# Patient Record
Sex: Female | Born: 2000 | Race: Black or African American | Hispanic: No | Marital: Single | Smoking: Never smoker
Health system: Southern US, Community
[De-identification: ages and names within clinical notes are randomized; demographics above are authoritative.]

## PROBLEM LIST (undated history)

## (undated) DIAGNOSIS — R569 Unspecified convulsions: Secondary | ICD-10-CM

## (undated) DIAGNOSIS — F840 Autistic disorder: Secondary | ICD-10-CM

## (undated) HISTORY — PX: WISDOM TOOTH EXTRACTION: SHX21

## (undated) HISTORY — PX: NO PAST SURGERIES: SHX2092

---
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
## 2021-04-20 ENCOUNTER — Telehealth (INDEPENDENT_AMBULATORY_CARE_PROVIDER_SITE_OTHER): Payer: Self-pay | Admitting: Family

## 2021-04-20 DIAGNOSIS — R569 Unspecified convulsions: Secondary | ICD-10-CM

## 2021-04-20 NOTE — Telephone Encounter (Signed)
  Who's calling (name and relationship to patient) :[place] ( self)   Best contact [phone]  Provider they UUE:[id number]   Reason for call: Needing a Pror Auth called in for medication refill [facility] Call ID # [id number]     PRESCRIPTION REFILL ONLY  Name of prescription: Levetiracetam  Pharmacy:[facility] [address] [place]

## 2021-04-21 ENCOUNTER — Encounter (INDEPENDENT_AMBULATORY_CARE_PROVIDER_SITE_OTHER): Payer: Self-pay

## 2021-04-22 MED ORDER — LEVETIRACETAM 750 MG PO TABS: ORAL_TABLET | ORAL | 5 refills | Status: DC

## 2021-04-22 NOTE — Telephone Encounter (Signed)
Spoke to patient and she just needed a refill, refill sent

## 2021-08-02 ENCOUNTER — Telehealth (INDEPENDENT_AMBULATORY_CARE_PROVIDER_SITE_OTHER): Payer: Self-pay | Admitting: Family

## 2021-08-02 NOTE — Telephone Encounter (Signed)
Attempted to call patient, left VM.

## 2021-08-02 NOTE — Telephone Encounter (Signed)
  Who's calling (name and relationship to patient) :Melvyn Neth [place] M (Self)  Best contact number: [phone] (Mobile) Provider they see: Elveria Rising, NP Reason for call:  Patient has seizure on Saturday and the earliest appt available has been scheduled which is 8/25. Patient hoping to be seen sooner if possible. Please advise   PRESCRIPTION REFILL ONLY  Name of prescription:  Pharmacy:

## 2021-08-02 NOTE — Telephone Encounter (Signed)
I called and spoke with patient. She said that on Saturday August 13th that she was sitting on the sofa, heard voices, and then awakened with paramedics by her side. She was not injured with the seizure. She said that she has been compliant with medication and gets at least 8 hours of sleep each night. I offered to see her tomorrow morning but she declined because she has to to go work. I will see her next week as scheduled. TG

## 2021-08-02 NOTE — Telephone Encounter (Signed)
I can see her tomorrow if she can come in at 10:45AM to check in for an 11 AM visit. TG

## 2021-08-11 ENCOUNTER — Encounter (INDEPENDENT_AMBULATORY_CARE_PROVIDER_SITE_OTHER): Payer: Self-pay | Admitting: Family

## 2021-08-11 ENCOUNTER — Ambulatory Visit (INDEPENDENT_AMBULATORY_CARE_PROVIDER_SITE_OTHER): Payer: Medicaid Other | Admitting: Family

## 2021-08-11 ENCOUNTER — Other Ambulatory Visit: Payer: Self-pay

## 2021-08-11 VITALS — BP 130/80 | HR 76 | Ht 66.5 in | Wt 200.6 lb

## 2021-08-11 DIAGNOSIS — F840 Autistic disorder: Secondary | ICD-10-CM | POA: Diagnosis not present

## 2021-08-11 DIAGNOSIS — R569 Unspecified convulsions: Secondary | ICD-10-CM | POA: Diagnosis not present

## 2021-08-11 MED ORDER — OXCARBAZEPINE 300 MG PO TABS: ORAL_TABLET | ORAL | 1 refills | Status: DC

## 2021-08-11 NOTE — Patient Instructions (Addendum)
Thank you for coming in today.   Instructions for you until your next appointment are as follows: Continue to take the Levetiracetam 750mg  - 1 tablet in the morning and 1 tablet at night Start taking Oxcarbazepine 300mg  - take 1/2 tablet in the morning and 1/2 tablet at night for 3 days, then take 1 tablet in the morning and at night after that Try not to miss any doses of your seizure medicines Please sign up for MyChart if you have not done so. Please plan to return for follow up in 6 weeks or sooner if needed.  At [facility], we are committed to providing exceptional care. You will receive a patient satisfaction survey through text or email regarding your visit today. Your opinion is important to me. Comments are appreciated.

## 2021-08-11 NOTE — Progress Notes (Signed)
Caitlyn Weaver   MRN:  [id number]  [birth year]   Provider: Elveria Rising NP-C Location of Care: [facility]  Visit type: Routine visit  Last visit: 10/19/2020  Referral source: Sharilyn Sites, PA-C History from: patient and chcn chart  Brief history:  Copied from previous record: History of autism spectrum disorder and new onset seizure activity on 07/26/2019 that lasted approximately 15 minutes. She was seen in the ER and then evaluated in the office by Dr Devonne Doughty. An EEG was normal. She had a second seizure on September 4 and was started on Levetiracetam on September 03, 2019. The most recent seizure occurred January 31, 2020 and the dose was increased at that time.  Today's concerns: Caitlyn is seen today because she contacted me last week to report that she had a seizure. On Saturday August 13th she was sitting on the sofa, heard voices, then awakened with paramedics at her side and was told that she had a convulsive seizure that lasted 3-4 minutes. She was not injured with the seizure. She was evaluated by EMS and not seen in the ED. Caitlyn has been compliant with medication and says that she gets at least 8 hours of sleep each night.   Caitlyn has been otherwise generally healthy since she was last seen. She has no other health concerns today other than previously mentioned.  Review of systems: Please see HPI for neurologic and other pertinent review of systems. Otherwise all other systems were reviewed and were negative.  Problem List: Patient Active Problem List   Diagnosis Date Noted   Seizures (HCC) 09/04/2019   Autism      Past Medical History:  Diagnosis Date   Autism    Seizures (HCC)     Past medical history comments: See HPI Copied from previous record: Birth History She was born full-term via normal vaginal delivery with no perinatal events.  Her birth weight was 7 pounds 9 ounces.  She developed delayed milestones on time  Surgical  history: Past Surgical History:  Procedure Laterality Date   NO PAST SURGERIES       Family history: family history is not on file.   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   She attended [employer].   She lives with both parents.    She is employed with [employer]   Social Determinants of Corporate investment banker Strain: Not on file  Food Insecurity: Not on file  Transportation Needs: Not on file  Physical Activity: Not on file  Stress: Not on file  Social Connections: Not on file  Intimate Partner Violence: Not on file   Past/failed meds:  Allergies: No Known Allergies   Immunizations:  There is no immunization history on file for this patient.    Diagnostics/Screenings: Copied from previous record: 08/12/2019 - rEEG - This EEG is normal during awake state.  Please note that normal EEG does not exclude epilepsy, clinical correlation is indicated. Keturah Shavers, MD  Physical Exam: BP 130/80   Pulse 76   Ht 5' 6.5" (1.689 m)   Wt 200 lb 9.6 oz (91 kg)   BMI 31.89 kg/m   General: Well developed, well nourished young woman, seated on exam table, in no evident distress,  black hair, brown eyes, right handed Head: Head normocephalic and atraumatic.  Oropharynx benign. Neck: Supple Cardiovascular: Regular rate and rhythm, no murmurs Respiratory: Breath sounds clear to auscultation Musculoskeletal: No obvious deformities or scoliosis Skin: No rashes or neurocutaneous  lesions  Neurologic Exam Mental Status: Awake and fully alert.  Oriented to place and time.  Recent and remote memory intact.  Attention span and concentration appropriate. Fund of knowledge subnormal for age. Has flat affect. Has concrete language. She needed some prompting and coaching in order to participate in examination. Cranial Nerves: Fundoscopic exam reveals sharp disc margins.  Pupils equal, briskly reactive to light.  Extraocular movements full without nystagmus.  Visual fields full to confrontation.  Hearing intact and symmetric to finger rub.  Facial sensation intact.  Face tongue, palate move normally and symmetrically.  Neck flexion and extension normal. Motor: Normal bulk and tone. Normal strength in all tested extremity muscles. Sensory: Intact to touch and temperature in all extremities.  Coordination: Rapid alternating movements normal in all extremities.  Finger-to-nose and heel-to shin performed accurately bilaterally.  Romberg negative. Gait and Station: Arises from chair without difficulty.  Stance is normal. Gait demonstrates normal stride length and balance.   Able to heel, toe and tandem walk without difficulty. Reflexes: 1+ and symmetric. Toes downgoing.   Impression: Seizures (HCC) - Plan: Oxcarbazepine (TRILEPTAL) 300 MG tablet  Autism   Recommendations for plan of care: The patient's previous [facility] records were reviewed. Caitlyn has neither had nor required imaging or lab studies since the last visit. 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 She has been taking and tolerating Levetiracetam and had been seizure free since January 31, 2020 until she had a breakthrough seizure on July 30, 2021. She has been compliant with medication and gets at least 8 hours of sleep each night I recommended that we add Oxcarbazepine to her regimen and reviewed with her how to take the medication. I asked her to return for follow up in 6 weeks or  sooner if needed. Caitlyn agreed with the plans made today.   The medication list was reviewed and reconciled. No changes were made in the prescribed medications today. A complete medication list was provided to the patient.  Return in about 6 weeks (around 09/22/2021).   Allergies as of 08/11/2021   No Known Allergies      Medication List        Accurate as of August 11, 2021 11:59 PM. If you have any questions, ask your nurse or doctor.          levETIRAcetam 750 MG tablet Commonly known as: Keppra Take 1 tablet twice per day   Oxcarbazepine 300 MG tablet Commonly known as: TRILEPTAL Take 1/2 tablet twice per day for 3 days, then take 1 tablet twice per day thereafter Started by: Elveria Rising, NP   Valtoco 20 MG Dose 2 x 10 MG/0.1ML Lqpk Generic drug: diazePAM (20 MG Dose) Place 20 mg into the nose as needed (Give nasally for seizures lasting 2 minutes or longer).  Total time spent with the patient was 30 minutes, of which 50% or more was spent in counseling and coordination of care.  Elveria Rising NP-C [facility] Ph. [phone] Fax [phone]

## 2021-08-20 ENCOUNTER — Encounter (INDEPENDENT_AMBULATORY_CARE_PROVIDER_SITE_OTHER): Payer: Self-pay | Admitting: Family

## 2021-09-27 ENCOUNTER — Other Ambulatory Visit: Payer: Self-pay

## 2021-09-27 ENCOUNTER — Ambulatory Visit (INDEPENDENT_AMBULATORY_CARE_PROVIDER_SITE_OTHER): Payer: Medicaid Other | Admitting: Family

## 2021-09-27 ENCOUNTER — Encounter (INDEPENDENT_AMBULATORY_CARE_PROVIDER_SITE_OTHER): Payer: Self-pay | Admitting: Family

## 2021-09-27 VITALS — BP 128/88 | HR 88 | Ht 66.73 in | Wt 191.2 lb

## 2021-09-27 DIAGNOSIS — R440 Auditory hallucinations: Secondary | ICD-10-CM | POA: Diagnosis not present

## 2021-09-27 DIAGNOSIS — F840 Autistic disorder: Secondary | ICD-10-CM | POA: Diagnosis not present

## 2021-09-27 DIAGNOSIS — R569 Unspecified convulsions: Secondary | ICD-10-CM

## 2021-09-29 NOTE — Progress Notes (Signed)
Caitlyn Weaver   MRN:  [id number]  07-30-2001   Provider: Elveria Rising NP-C Location of Care: [facility] Child Neurology  Visit type: Routine return visit  Last visit: 08/11/2021  Referral source: Sharilyn Sites, PA-C History from: Epic chart and patient  Brief history:  Copied from previous record: History of autism spectrum disorder and new onset seizure activity on 07/26/2019 that lasted approximately 15 minutes. She was seen in the ER and then evaluated in the office by Dr Devonne Doughty. An EEG was normal. She had a second seizure on September 4 and was started on Levetiracetam on September 03, 2019. The most recent seizure occurred on July 30, 2021. Oxcarbazepine was added at that time.  Today's concerns: Caitlyn reports today that she has remained seizure free since Oxcarbazepine was added to her regimen. She has tolerated the medication without obvious side effects. When the last seizure occurred, Caitlyn reported hearing voices prior to the seizure. She reports today that she continues to hear voices or sometimes music, but that words or sounds are pleasant and not disturbing to her. She is compliant with medication and gets at least 8 hours of sleep each night.   Caitlyn has been otherwise generally healthy since she was last seen. She has no other health concerns today other than previously mentioned.  Review of systems: Please see HPI for neurologic and other pertinent review of systems. Otherwise all other systems were reviewed and were negative.  Problem List: Patient Active Problem List   Diagnosis Date Noted   Seizures (HCC) 09/04/2019   Autism      Past Medical History:  Diagnosis Date   Autism    Seizures (HCC)     Past medical history comments: See HPI Copied from previous record: Birth History She was born full-term via normal vaginal delivery with no perinatal events.  Her birth weight was 7 pounds 9 ounces.  She developed delayed milestones on  time  Surgical history: Past Surgical History:  Procedure Laterality Date   NO PAST SURGERIES       Family history: family history is not on file.   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   She attended [employer].   She lives with both parents.    She is employed with [employer]   Social Determinants of Corporate investment banker Strain: Not on file  Food Insecurity: Not on file  Transportation Needs: Not on file  Physical Activity: Not on file  Stress: Not on file  Social Connections: Not on file  Intimate Partner Violence: Not on file    Past/failed meds:  Allergies: No Known Allergies   Immunizations:  There is no immunization history on file for this patient.   Diagnostics/Screenings: Copied from previous record: 08/12/2019 - rEEG - This EEG is normal during awake state. Please note that normal EEG does not exclude epilepsy,  clinical correlation is indicated. Keturah Shavers, MD  Physical Exam: BP 128/88   Pulse 88   Ht 5' 6.73" (1.695 m)   Wt 191 lb 3.2 oz (86.7 kg)   BMI 30.19 kg/m   General: Well developed, well nourished young woman, seated on exam table, in no evident distress Head: Head normocephalic and atraumatic.  Oropharynx benign. Neck: Supple Cardiovascular: Regular rate and rhythm, no murmurs Respiratory: Breath sounds clear to auscultation Musculoskeletal: No obvious deformities or scoliosis Skin: No rashes or neurocutaneous lesions  Neurologic  Exam Mental Status: Awake and fully alert.  Oriented to place and time.  Recent and remote memory intact.  Attention span and concentration appropriate for age. Fund of knowledge subnormal for age. Has flat affect and concrete language. Eye contact is variable. She needed some prompting and coaching to answer questions.  Cranial Nerves: Fundoscopic exam reveals sharp disc margins.  Pupils equal, briskly reactive to light.  Extraocular movements full without nystagmus. Hearing intact and symmetric to whisper.  Facial sensation intact.  Face tongue, palate move normally and symmetrically. Shoulder shrug normal Motor: Normal bulk and tone. Normal strength in all tested extremity muscles. Sensory: Intact to touch and temperature in all extremities.  Coordination: Rapid alternating movements normal in all extremities.  Finger-to-nose and heel-to shin performed accurately bilaterally.  Romberg negative. Gait and Station: Arises from chair without difficulty.  Stance is normal. Gait demonstrates normal stride length and balance.   Able to heel, toe and tandem walk without difficulty. Reflexes: 1+ and symmetric. Toes downgoing.   Impression: Seizures (HCC)  Autism  Auditory hallucinations  Recommendations for plan of care: The patient's previous [facility] records were reviewed. Caitlyn has neither had nor required imaging or lab studies since the last visit. She is an 20 year old young woman with history of seizures and autism. She is taking and tolerating Levetiracetam and Oxcarbazepine, with the later being added in August after a seizure event. I talked with Caitlyn about being compliant with medication and getting enough sleep. I am concerned about her reports of auditory hallucinations but as she reports that the hallucinations are pleasant and not troubling to her, I will continue to monitor this for now. I asked Caitlyn to let me know if she has more breakthrough seizures. I will otherwise see her back  in follow up in 4 months or sooner if needed. She agreed with the plans made today.   The medication list was reviewed and reconciled. No changes were made in the prescribed medications today. A complete medication list was provided to the patient.  Return in about 4 months (around 01/28/2022).   Allergies as of 09/27/2021   No Known Allergies      Medication List        Accurate as of September 27, 2021 11:59 PM. If you have any questions, ask your nurse or doctor.          levETIRAcetam 750 MG tablet Commonly known as: Keppra Take 1 tablet twice per day   Oxcarbazepine 300 MG tablet Commonly known as: TRILEPTAL Take 1 tablet twice per day What changed: additional instructions Changed by: Elveria Rising, NP   Valtoco 20 MG Dose 2 x 10 MG/0.1ML Lqpk Generic drug: diazePAM (20 MG Dose) Place 20 mg into the nose as needed (Give nasally for seizures lasting 2 minutes or longer).      Total time spent with the patient was 20 minutes, of which 50% or more was spent in counseling and coordination  of care.  Rockwell Germany NP-C [facility] Ph. [phone] Fax [phone]

## 2021-10-01 ENCOUNTER — Encounter (INDEPENDENT_AMBULATORY_CARE_PROVIDER_SITE_OTHER): Payer: Self-pay | Admitting: Family

## 2021-10-01 DIAGNOSIS — R440 Auditory hallucinations: Secondary | ICD-10-CM | POA: Insufficient documentation

## 2021-10-01 MED ORDER — LEVETIRACETAM 750 MG PO TABS: ORAL_TABLET | ORAL | 5 refills | Status: DC

## 2021-10-01 MED ORDER — OXCARBAZEPINE 300 MG PO TABS: ORAL_TABLET | ORAL | 3 refills | Status: DC

## 2021-10-01 NOTE — Patient Instructions (Signed)
Thank you for coming in today.   Instructions for you until your next appointment are as follows: Continue taking the Levetiracetam 750mg  and Oxcarbazepine 300mg  tablets as prescribed Let me know if you have any seizures.  Try not to miss any doses of medication.  Be sure to get at least 8 hours of sleep each night, as not getting enough sleep can trigger seizures Please sign up for MyChart if you have not done so. Please plan to return for follow up in 4 months or sooner if needed.  At [facility], we are committed to providing exceptional care. You will receive a patient satisfaction survey through text or email regarding your visit today. Your opinion is important to me. Comments are appreciated.

## 2022-01-31 ENCOUNTER — Encounter (INDEPENDENT_AMBULATORY_CARE_PROVIDER_SITE_OTHER): Payer: Self-pay | Admitting: Family

## 2022-01-31 ENCOUNTER — Other Ambulatory Visit: Payer: Self-pay

## 2022-01-31 ENCOUNTER — Ambulatory Visit (INDEPENDENT_AMBULATORY_CARE_PROVIDER_SITE_OTHER): Payer: Medicaid Other | Admitting: Family

## 2022-01-31 VITALS — BP 122/64 | Wt 200.0 lb

## 2022-01-31 DIAGNOSIS — F840 Autistic disorder: Secondary | ICD-10-CM | POA: Diagnosis not present

## 2022-01-31 DIAGNOSIS — R569 Unspecified convulsions: Secondary | ICD-10-CM | POA: Diagnosis not present

## 2022-01-31 DIAGNOSIS — R440 Auditory hallucinations: Secondary | ICD-10-CM

## 2022-01-31 MED ORDER — LEVETIRACETAM 750 MG PO TABS: ORAL_TABLET | ORAL | 3 refills | Status: DC

## 2022-01-31 MED ORDER — OXCARBAZEPINE 300 MG PO TABS: ORAL_TABLET | ORAL | 3 refills | Status: DC

## 2022-01-31 NOTE — Progress Notes (Signed)
Panama M Belfield   MRN:  [id number]  [birth year]   Provider: Elveria Rising NP-C Location of Care: [facility] Neurology  Visit type: Return visit  Last visit: 09/27/2021  Referral source: Sharilyn Sites, PA-C History from: Epic chart and patient  Brief history:  Copied from previous record: History of autism spectrum disorder and new onset seizure activity on 07/26/2019 that lasted approximately 15 minutes. She was seen in the ER and then evaluated in the office by Dr Devonne Doughty. An EEG was normal. She had a second seizure on September 4 and was started on Levetiracetam on September 03, 2019. The most recent seizure occurred on July 30, 2021. Oxcarbazepine was added at that time.   Today's concerns: Panama reports today that she had a seizure on December 1st. She said that she had some espresso coffee and a short time later had auditory hallucinations, then a convulsive seizure. She is unsure how long it lasted but says that her mother administered Valtoco. She says that she had a headache for awhile afterwards, and has been otherwise seizure free since her last visit.   [place] says that she does not miss medication doses and gets at least 8 hours of sleep each night. She works during the day at [employer] and is on a regular schedule.  [place] has been otherwise generally healthy since she was last seen. She has no other health concerns for her today other than previously mentioned.  Review of systems: Please see HPI for neurologic and other pertinent review of systems. Otherwise all other systems were reviewed and were negative.  Problem List: Patient Active Problem List   Diagnosis Date Noted   Auditory hallucination 10/01/2021   Seizures (HCC) 09/04/2019   Autism      Past Medical History:  Diagnosis Date   Autism    Seizures (HCC)     Past medical history comments: See HPI Copied from previous record: Birth History She was born full-term via normal vaginal  delivery with no perinatal events.  Her birth weight was 7 pounds 9 ounces.  She developed delayed milestones   Surgical history: Past Surgical History:  Procedure Laterality Date   NO PAST SURGERIES       Family history: family history is not on file.   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   She attended [employer].   She lives with both parents.    She is employed with [employer] full time   Social Determinants of Corporate investment banker Strain: Not on file  Food Insecurity: Not on file  Transportation Needs: Not on file  Physical Activity: Not on file  Stress: Not on file  Social Connections: Not on file  Intimate Partner Violence: Not on file    Past/failed meds:  Allergies: No Known Allergies   Immunizations:  There is  no immunization history on file for this patient.    Diagnostics/Screenings: Copied from previous record: 08/12/2019 - rEEG - This EEG is normal during awake state. Please note that normal EEG does not exclude epilepsy, clinical correlation is indicated. Keturah Shavers, MD  Physical Exam: BP 122/64    Wt 200 lb (90.7 kg)    BMI 31.58 kg/m   General: Well developed, well nourished young woman, seated on exam table, in no evident distress Head: Head normocephalic and atraumatic.  Oropharynx benign. Neck: Supple Cardiovascular: Regular rate and rhythm, no murmurs Respiratory: Breath sounds clear to  auscultation Musculoskeletal: No obvious deformities or scoliosis Skin: No rashes or neurocutaneous lesions  Neurologic Exam Mental Status: Awake and fully alert.  Oriented to place and time.  Recent and remote memory intact.  Attention span and concentration appropriate.  Mood and affect appropriate. Fund of knowledge subnormal for age. Has flat affect and concrete language. Eye contact is variable. She needed some prompting and coaching to be able to answer questions. Cranial Nerves: Fundoscopic exam reveals sharp disc margins.  Pupils equal, briskly reactive to light.  Extraocular movements full without nystagmus. Hearing intact and symmetric to whisper.  Facial sensation intact.  Face tongue, palate move normally and symmetrically. Shoulder shrug normal Motor: Normal bulk and tone. Normal strength in all tested extremity muscles. Sensory: Intact to touch and temperature in all extremities.  Coordination: Rapid alternating movements normal in all extremities.  Finger-to-nose and heel-to shin performed accurately bilaterally.  Romberg negative. Gait and Station: Arises from chair without difficulty.  Stance is normal. Gait demonstrates normal stride length and balance.   Able to heel, toe and tandem walk without difficulty. Reflexes: 1+ and symmetric. Toes downgoing.   Impression: Seizures (HCC) - Plan: Oxcarbazepine (TRILEPTAL) 300 MG tablet, levETIRAcetam (KEPPRA) 750 MG tablet  Autism  Auditory hallucination   Recommendations for plan of care: The patient's previous [facility] records were reviewed. [place] has neither had nor required imaging or lab studies since the last visit. 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 She has had 1 seizure since her last visit. [place] is compliant with medication and gets at least 8 hours of sleep each night. I recommended increasing the Oxcarbazepine dose to 1+1/2 tablets BID  and she agreed with this plan. I will see her back in follow up in 4 months or sooner if needed.  The medication list was reviewed and reconciled. I reviewed changes that were made in the prescribed medications today. A complete medication list was provided to the patient.  Return in about 4 months (around 05/31/2022).   Allergies as of 01/31/2022   No Known Allergies      Medication List        Accurate as of January 31, 2022 11:59 PM. If you have any questions, ask your nurse or doctor.          levETIRAcetam 750 MG tablet Commonly known as: Keppra Take 1 tablet twice per day   Oxcarbazepine 300 MG tablet Commonly known as: TRILEPTAL Take 1+ 1/2 tablets twice per day What changed: additional instructions Changed by: Elveria Rising, NP   Valtoco 20 MG Dose 2 x 10 MG/0.1ML Lqpk Generic drug: diazePAM (20 MG Dose) Place 20 mg into the nose as needed (Give nasally for seizures lasting 2 minutes or longer).      Total time spent with the patient was 20 minutes, of which  50% or more was spent in counseling and coordination of care.  Elveria Rising NP-C [facility] Ph. [phone] Fax [phone]

## 2022-01-31 NOTE — Patient Instructions (Signed)
It was a pleasure to see you today!  Instructions for you until your next appointment are as follows: For the Oxcarbazepine - start taking 1+ 1/2 tablets in the morning and at night. I sent in a new prescription with these instructions.  Continue taking the Levetiracetam - 1 tablet in the morning and at night Let me know if you have more seizures Please sign up for MyChart if you have not done so. Please plan to return for follow up in 4 months or sooner if needed.    Feel free to contact our office during normal business hours at [phone] with questions or concerns. If there is no answer or the call is outside business hours, please leave a message and our clinic staff will call you back within the next business day.  If you have an urgent concern, please stay on the line for our after-hours answering service and ask for the on-call neurologist.     I also encourage you to use MyChart to communicate with me more directly. If you have not yet signed up for MyChart within [facility], the front desk staff can help you. However, please note that this inbox is NOT monitored on nights or weekends, and response can take up to 2 business days.  Urgent matters should be discussed with the on-call pediatric neurologist.   At [facility], we are committed to providing exceptional care. You will receive a patient satisfaction survey through text or email regarding your visit today. Your opinion is important to me. Comments are appreciated.

## 2022-02-01 ENCOUNTER — Encounter (INDEPENDENT_AMBULATORY_CARE_PROVIDER_SITE_OTHER): Payer: Self-pay | Admitting: Family

## 2022-05-01 ENCOUNTER — Other Ambulatory Visit (INDEPENDENT_AMBULATORY_CARE_PROVIDER_SITE_OTHER): Payer: Self-pay

## 2022-05-01 DIAGNOSIS — R569 Unspecified convulsions: Secondary | ICD-10-CM

## 2022-05-01 MED ORDER — LEVETIRACETAM 750 MG PO TABS: ORAL_TABLET | ORAL | 2 refills | Status: DC

## 2022-05-08 ENCOUNTER — Telehealth (INDEPENDENT_AMBULATORY_CARE_PROVIDER_SITE_OTHER): Payer: Self-pay | Admitting: Family

## 2022-05-08 DIAGNOSIS — R569 Unspecified convulsions: Secondary | ICD-10-CM

## 2022-05-08 MED ORDER — VALTOCO 20 MG DOSE 10 MG/0.1ML NA LQPK: 20.0000 mg | NASAL | 5 refills | Status: DC | PRN

## 2022-05-08 NOTE — Telephone Encounter (Signed)
  Name of who is calling:Aja   Caller's Relationship to Patient:Self   Best contact [phone]  Provider they GGY:IR[facility]   Reason for call:Medication Refill      PRESCRIPTION REFILL ONLY  Name of prescription:Valtoco   Pharmacy:[facility] [address]

## 2022-05-08 NOTE — Telephone Encounter (Signed)
Rx sent electronically. TG 

## 2022-06-04 ENCOUNTER — Other Ambulatory Visit (INDEPENDENT_AMBULATORY_CARE_PROVIDER_SITE_OTHER): Payer: Self-pay | Admitting: Family

## 2022-06-04 DIAGNOSIS — R569 Unspecified convulsions: Secondary | ICD-10-CM

## 2022-06-06 ENCOUNTER — Encounter (INDEPENDENT_AMBULATORY_CARE_PROVIDER_SITE_OTHER): Payer: Self-pay | Admitting: Family

## 2022-06-06 ENCOUNTER — Ambulatory Visit (INDEPENDENT_AMBULATORY_CARE_PROVIDER_SITE_OTHER): Payer: Medicaid Other | Admitting: Family

## 2022-06-06 VITALS — BP 120/100 | HR 52 | Ht 67.32 in | Wt 203.5 lb

## 2022-06-06 DIAGNOSIS — F840 Autistic disorder: Secondary | ICD-10-CM | POA: Diagnosis not present

## 2022-06-06 DIAGNOSIS — R440 Auditory hallucinations: Secondary | ICD-10-CM

## 2022-06-06 DIAGNOSIS — R569 Unspecified convulsions: Secondary | ICD-10-CM

## 2022-06-06 MED ORDER — OXCARBAZEPINE 600 MG PO TABS
ORAL_TABLET | ORAL | 5 refills | Status: DC
Start: 2022-06-06 — End: 2022-09-28

## 2022-06-06 MED ORDER — LEVETIRACETAM 750 MG PO TABS: ORAL_TABLET | ORAL | 5 refills | Status: DC

## 2022-06-06 NOTE — Progress Notes (Unsigned)
Caitlyn Weaver   MRN:  [id number]  Mar 31, 2001   Provider: Elveria Rising NP-C Location of Care: [facility] Child Neurology  Visit type: Return visit  Last visit: 01/31/2022  Referral source: Sharilyn Sites, PA-C History from: Epic chart and patient  Brief history:  Copied from previous record: History of autism spectrum disorder and new onset seizure activity on 07/26/2019 that lasted approximately 15 minutes. She was seen in the ER and then evaluated in the office by Dr Devonne Doughty. An EEG was normal. She had a second seizure on September 4 and was started on Levetiracetam on September 03, 2019. The most recent seizure occurred on July 30, 2021. Oxcarbazepine was added at that time.  Today's concerns:  *** has been otherwise generally healthy since she was last seen. Neither *** nor mother have other health concerns for *** today other than previously mentioned.   Review of systems: Please see HPI for neurologic and other pertinent review of systems. Otherwise all other systems were reviewed and were negative.  Problem List: Patient Active Problem List   Diagnosis Date Noted   Auditory hallucination 10/01/2021   Seizures (HCC) 09/04/2019   Autism      Past Medical History:  Diagnosis Date   Autism    Seizures (HCC)     Past medical history comments: See HPI Copied from previous record: Birth History She was born full-term via normal vaginal delivery with no perinatal events.  Her birth weight was 7 pounds 9 ounces. Milestones were delayed.   Surgical history: Past Surgical History:  Procedure Laterality Date   NO PAST SURGERIES       Family history: family history is not on file.   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   She attended [employer].   She lives with both parents.    She is employed with [employer] full time   Social Determinants of Corporate investment banker Strain: Not on file  Food Insecurity: Not on file  Transportation Needs: Not on file  Physical Activity: Not on file  Stress: Not on file  Social Connections: Not on file  Intimate Partner Violence: Not on file      Past/failed meds: Copied from previous record:  Allergies: No Known Allergies    Immunizations:  There is no immunization history on file for this patient.    Diagnostics/Screenings: Copied from previous record: 08/12/2019 - rEEG - This EEG is normal during awake state. Please note that normal EEG does not exclude epilepsy, clinical correlation is indicated. Keturah Shavers, MD  Physical Exam: There were no vitals taken for this visit.  General: Well developed, well nourished, seated, in no evident distress Head: Head normocephalic and atraumatic.  Oropharynx benign. Neck: Supple Cardiovascular: Regular rate and rhythm, no murmurs Respiratory: Breath sounds clear to auscultation Musculoskeletal: No obvious deformities  or scoliosis Skin: No rashes or neurocutaneous lesions  Neurologic Exam Mental Status: Awake and fully alert.  Oriented to place and time.  Recent and remote memory intact.  Attention span, concentration, and fund of knowledge appropriate.  Mood and affect appropriate. Cranial Nerves: Fundoscopic exam reveals sharp disc margins.  Pupils equal, briskly reactive to light.  Extraocular movements full without nystagmus. Hearing intact and symmetric to whisper.  Facial sensation intact.  Face tongue, palate  move normally and symmetrically. Shoulder shrug normal Motor: Normal bulk and tone. Normal strength in all tested extremity muscles. Sensory: Intact to touch and temperature in all extremities.  Coordination: Rapid alternating movements normal in all extremities.  Finger-to-nose and heel-to shin performed accurately bilaterally.  Romberg negative. Gait and Station: Arises from chair without difficulty.  Stance is normal. Gait demonstrates normal stride length and balance.   Able to heel, toe and tandem walk without difficulty. Reflexes: 1+ and symmetric. Toes downgoing.   Impression: No diagnosis found.    Recommendations for plan of care: The patient's previous Epic records were reviewed. Caitlyn has neither had nor required imaging or lab studies since the last visit.   The medication list was reviewed and reconciled. No changes were made in the prescribed medications today. A complete medication list was provided to the patient.  No orders of the defined types were placed in this encounter.   No follow-ups on file.   Allergies as of 06/06/2022   No Known Allergies      Medication List        Accurate as of June 06, 2022 11:14 AM. If you have any questions, ask your nurse or doctor.          levETIRAcetam 750 MG tablet Commonly known as: Keppra Take 1 tablet twice per day   Oxcarbazepine 300 MG tablet Commonly known as: TRILEPTAL TAKE 1 AND 1/2 TABLETS BY MOUTH TWICE DAILY   Valtoco 20 MG Dose 2 x 10 MG/0.1ML Lqpk Generic drug: diazePAM (20 MG Dose) Place 20 mg into the nose as needed (Give nasally for seizures lasting 2 minutes or longer).      Total time spent with the patient was *** minutes, of which 50% or more was spent in counseling and coordination of care.  Elveria Rising NP-C [facility] Child Neurology Ph. [phone] Fax [phone]

## 2022-06-06 NOTE — Patient Instructions (Signed)
It was a pleasure to see you today!  Instructions for you until your next appointment are as follows: I have changed the strength of your Oxcarbazepine tablets to 600mg . When you get your new prescription of this, take 1 tablet in the morning and 1 tablet at night Stop taking the Oxcarbazepine 300mg  tablets when you get the new prescription.  Continue taking the Levetiracetam 750mg  tablets - 1 tablet in the morning and 1 tablet at night Let me know if you have more seizures.  Your blood pressure was higher than expected today at 120/100. The bottom number should be lower than that. Please follow up with your PCP (primary care provider) about that.  Please sign up for MyChart if you have not done so. Please plan to return for follow up in 3 months or sooner if needed.   Feel free to contact our office during normal business hours at [phone] with questions or concerns. If there is no answer or the call is outside business hours, please leave a message and our clinic staff will call you back within the next business day.  If you have an urgent concern, please stay on the line for our after-hours answering service and ask for the on-call neurologist.     I also encourage you to use MyChart to communicate with me more directly. If you have not yet signed up for MyChart within [facility], the front desk staff can help you. However, please note that this inbox is NOT monitored on nights or weekends, and response can take up to 2 business days.  Urgent matters should be discussed with the on-call pediatric neurologist.   At [facility], we are committed to providing exceptional care. You will receive a patient satisfaction survey through text or email regarding your visit today. Your opinion is important to me. Comments are appreciated.

## 2022-06-10 ENCOUNTER — Encounter (INDEPENDENT_AMBULATORY_CARE_PROVIDER_SITE_OTHER): Payer: Self-pay | Admitting: Family

## 2022-09-05 ENCOUNTER — Ambulatory Visit (INDEPENDENT_AMBULATORY_CARE_PROVIDER_SITE_OTHER): Payer: Medicaid Other | Admitting: Family

## 2022-09-28 ENCOUNTER — Encounter (INDEPENDENT_AMBULATORY_CARE_PROVIDER_SITE_OTHER): Payer: Self-pay | Admitting: Family

## 2022-09-28 ENCOUNTER — Ambulatory Visit (INDEPENDENT_AMBULATORY_CARE_PROVIDER_SITE_OTHER): Payer: Medicaid Other | Admitting: Family

## 2022-09-28 VITALS — BP 138/92 | HR 90 | Wt 206.4 lb

## 2022-09-28 DIAGNOSIS — F840 Autistic disorder: Secondary | ICD-10-CM

## 2022-09-28 DIAGNOSIS — R030 Elevated blood-pressure reading, without diagnosis of hypertension: Secondary | ICD-10-CM | POA: Diagnosis not present

## 2022-09-28 DIAGNOSIS — R569 Unspecified convulsions: Secondary | ICD-10-CM

## 2022-09-28 MED ORDER — OXCARBAZEPINE 600 MG PO TABS: ORAL_TABLET | ORAL | 3 refills | Status: DC

## 2022-09-28 MED ORDER — LEVETIRACETAM 750 MG PO TABS: ORAL_TABLET | ORAL | 3 refills | Status: DC

## 2022-09-28 NOTE — Patient Instructions (Signed)
It was a pleasure to see you today!  Instructions for you until your next appointment are as follows: I updated the prescriptions for you to receive a 90 day supply each time you refill them. If you have problems filling the medication in the future, please let me know Let me know if you have any seizures Be sure to see your PCP about your blood pressure Please sign up for MyChart if you have not done so. Please plan to return for follow up in 6 months or sooner if needed.    Feel free to contact our office during normal business hours at [phone] with questions or concerns. If there is no answer or the call is outside business hours, please leave a message and our clinic staff will call you back within the next business day.  If you have an urgent concern, please stay on the line for our after-hours answering service and ask for the on-call neurologist.     I also encourage you to use MyChart to communicate with me more directly. If you have not yet signed up for MyChart within [facility], the front desk staff can help you. However, please note that this inbox is NOT monitored on nights or weekends, and response can take up to 2 business days.  Urgent matters should be discussed with the on-call pediatric neurologist.   At [facility], we are committed to providing exceptional care. You will receive a patient satisfaction survey through text or email regarding your visit today. Your opinion is important to me. Comments are appreciated.

## 2022-09-28 NOTE — Progress Notes (Signed)
Caitlyn Weaver   MRN:  [id number]  [birth year]   Provider: Rockwell Germany NP-C Location of Care: [facility] Child Neurology  Visit type: Return visit  Last visit: 06/06/22  Referral source: Jolinda Croak, MD  History from: Epic chart and patient  Brief history:  Copied from previous record: History of autism spectrum disorder and new onset seizure activity on 07/26/2019 that lasted approximately 15 minutes. She was seen in the ER and then evaluated in the office by Dr Jordan Hawks. An EEG was normal. She had a second seizure on September 4 and was started on Levetiracetam on September 03, 2019. Another seizure occurred on July 30, 2021. Oxcarbazepine was added at that time. She has reported hearing voices just prior to a seizure occurring.   Today's concerns: When Caitlyn Marino was last seen, she had experienced a seizure in March and in May. The Oxcarbazepine dose was increased at the June visit. She reports today that she has remained seizure free since then. Caitlyn Marino is compliant with her medication and stays on a sleep schedule. She continues to work at [employer] and lives with her mother.   Caitlyn Marino has been otherwise generally healthy since she was last seen. She has no other health concerns today other than previously mentioned.  Review of systems: Please see HPI for neurologic and other pertinent review of systems. Otherwise all other systems were reviewed and were negative.  Problem List: Patient Active Problem List   Diagnosis Date Noted   Auditory hallucination 10/01/2021   Seizures ([place]) 09/04/2019   Autism      Past Medical History:  Diagnosis Date   Autism    Seizures ([place])     Past medical history comments: See HPI Copied from previous record: Birth History She was born full-term via normal vaginal delivery with no perinatal events.  Her birth weight was 7 pounds 9 ounces. Milestones were delayed  Surgical history: Past Surgical History:  Procedure  Laterality Date   NO PAST SURGERIES       Family history: family history is not on file.   Social history: Social History   Socioeconomic History   Marital status: Single    Spouse name: Not on file   Number of children: Not on file   Years of education: Not on file   Highest education level: Not on file  Occupational History   Not on file  Tobacco Use   Smoking status: Never    Passive exposure: Current   Smokeless tobacco: Never  Substance and Sexual Activity   Alcohol use: Not on file   Drug use: Not on file   Sexual activity: Not on file  Other Topics Concern   Not on file  Social History Narrative   Caitlyn Marino is a high school graduate 2021   She attended [employer].   She lives with both parents.    She is employed with [employer] full time   Social Determinants of Radio broadcast assistant Strain: Not on file  Food Insecurity: Not on file  Transportation Needs: Not on file  Physical Activity: Not on file  Stress: Not on file  Social Connections: Not on file  Intimate Partner Violence: Not on file    Past/failed meds:  Allergies: No Known Allergies   Immunizations:  There is no immunization history on file for this patient.   Diagnostics/Screenings: Copied from previous record: 08/12/2019 - rEEG - This EEG is normal during awake state. Please note that normal EEG does not  exclude epilepsy, clinical correlation is indicated. Keturah Shavers, MD  Physical Exam: BP (!) 138/92 (BP Location: Right Arm, Patient Position: Sitting, Cuff Size: Large)   Pulse 90   Wt 206 lb 5.6 oz (93.6 kg)   BMI 32.01 kg/m   General: Well developed, well nourished young woman, seated on exam table, in no evident distress Head: Head normocephalic and atraumatic.  Oropharynx benign. Neck: Supple Cardiovascular: Regular rate and rhythm, no murmurs Respiratory: Breath sounds clear to auscultation Musculoskeletal: No obvious deformities or scoliosis Skin: No rashes or  neurocutaneous lesions  Neurologic Exam Mental Status: Awake and fully alert.  Oriented to place and time. She has concrete language. Has good eye contact and is attentive during the conversation. Affect is flat. Cranial Nerves: Fundoscopic exam reveals sharp disc margins.  Pupils equal, briskly reactive to light.  Extraocular movements full without nystagmus. Hearing intact and symmetric to whisper.  Facial sensation intact.  Face tongue, palate move normally and symmetrically. Shoulder shrug normal Motor: Normal bulk and tone. Normal strength in all tested extremity muscles. Sensory: Intact to touch and temperature in all extremities.  Coordination: Rapid alternating movements normal in all extremities.  Finger-to-nose and heel-to shin performed accurately bilaterally.  Romberg negative. Gait and Station: Arises from chair without difficulty.  Stance is normal. Gait demonstrates normal stride length and balance.   Able to heel, toe and tandem walk without difficulty. Reflexes: 1+ and symmetric. Toes downgoing.   Impression: Seizures (HCC) - Plan: levETIRAcetam (KEPPRA) 750 MG tablet, oxcarbazepine (TRILEPTAL) 600 MG tablet  Autism  Elevated blood pressure reading   Recommendations for plan of care: The patient's previous Epic records were reviewed. [place] has neither had nor required imaging or lab studies since the last visit. She has remained seizure free on Levetiracetam and Oxcarbazepine since the dose was increased in June 2023.   When [place] was last seen, she was instructed to follow up with her PCP for elevated blood pressure of 120/100 but did not do so. Her blood pressure is elevated again today and I talked with her about the importance of following up with her PCP as instructed. She agreed to call her provider to schedule an appointment.  The medication list was reviewed and reconciled. No changes were made in the prescribed medications today. A complete medication list was  provided to the patient.  Return in about 6 months (around 03/30/2023).   Allergies as of 09/28/2022   No Known Allergies      Medication List        Accurate as of September 28, 2022 11:59 PM. If you have any questions, ask your nurse or doctor.          levETIRAcetam 750 MG tablet Commonly known as: Keppra Take 1 tablet twice per day   oxcarbazepine 600 MG tablet Commonly known as: Trileptal Take 1 tablet in the morning and 1 tablet at night   Valtoco 20 MG Dose 2 x 10 MG/0.1ML Lqpk Generic drug: diazePAM (20 MG Dose) Place 20 mg into the nose as needed (Give nasally for seizures lasting 2 minutes or longer).      Total time spent with the patient was 20 minutes, of which 50% or more was spent in counseling and coordination of care.  Elveria Rising NP-C [facility] Child [facility]rology Ph. [phone] Fax [phone]

## 2022-10-01 ENCOUNTER — Encounter (INDEPENDENT_AMBULATORY_CARE_PROVIDER_SITE_OTHER): Payer: Self-pay | Admitting: Family

## 2022-10-01 DIAGNOSIS — R030 Elevated blood-pressure reading, without diagnosis of hypertension: Secondary | ICD-10-CM | POA: Insufficient documentation

## 2023-04-03 ENCOUNTER — Encounter (INDEPENDENT_AMBULATORY_CARE_PROVIDER_SITE_OTHER): Payer: Self-pay | Admitting: Family

## 2023-04-03 ENCOUNTER — Ambulatory Visit (INDEPENDENT_AMBULATORY_CARE_PROVIDER_SITE_OTHER): Payer: Medicaid Other | Admitting: Family

## 2023-04-03 VITALS — BP 130/90 | HR 90 | Ht 66.73 in | Wt 208.0 lb

## 2023-04-03 DIAGNOSIS — F840 Autistic disorder: Secondary | ICD-10-CM

## 2023-04-03 DIAGNOSIS — R569 Unspecified convulsions: Secondary | ICD-10-CM | POA: Diagnosis not present

## 2023-04-03 MED ORDER — OXCARBAZEPINE 600 MG PO TABS: ORAL_TABLET | ORAL | 3 refills | Status: DC

## 2023-04-03 MED ORDER — LEVETIRACETAM 750 MG PO TABS: ORAL_TABLET | ORAL | 3 refills | Status: DC

## 2023-04-03 NOTE — Patient Instructions (Signed)
It was a pleasure to see you today!  Instructions for you until your next appointment are as follows: Continue taking your seizure medicines as prescribed. Do not stop taking the medications unless instructed to do so.  If you continue to be seizure free, we can perform an EEG this fall to determine if it would be safe for you to be tapered off of seizure medicines.  Please sign up for MyChart if you have not done so. Please plan to return for follow up in 6 months or sooner if needed.   Feel free to contact our office during normal business hours at [phone] with questions or concerns. If there is no answer or the call is outside business hours, please leave a message and our clinic staff will call you back within the next business day.  If you have an urgent concern, please stay on the line for our after-hours answering service and ask for the on-call neurologist.     I also encourage you to use MyChart to communicate with me more directly. If you have not yet signed up for MyChart within [facility], the front desk staff can help you. However, please note that this inbox is NOT monitored on nights or weekends, and response can take up to 2 business days.  Urgent matters should be discussed with the on-call pediatric neurologist.   At [facility], we are committed to providing exceptional care. You will receive a patient satisfaction survey through text or email regarding your visit today. Your opinion is important to me. Comments are appreciated.

## 2023-04-03 NOTE — Progress Notes (Signed)
Caitlyn M Weaver   MRN:  [id number]  03-Feb-2001   Provider: Elveria Rising NP-C Location of Care: [facility]  Visit type: Return visit  Last visit: 09/28/2022  Referral source: Stevphen Rochester, MD History from: Epic chart and patient  Brief history:  Copied from previous record: History of autism spectrum disorder and new onset seizure activity on 07/26/2019 that lasted approximately 15 minutes. She was seen in the ER and then evaluated in the office by Dr Devonne Doughty. An EEG was normal. She had a second seizure on September 4 and was started on Levetiracetam on September 03, 2019. Another seizure occurred on July 30, 2021. Oxcarbazepine was added at that time. She has reported hearing voices just prior to a seizure occurring.   Today's concerns: Has remained seizure free. Interested in stopping her medication since no seizures have occurred Does not drive Continues to work at [employer]. Wants to find a different job. Caitlyn has been otherwise generally healthy since she was last seen. No health concerns today other than previously mentioned.  Review of systems: Please see HPI for neurologic and other pertinent review of systems. Otherwise all other systems were reviewed and were negative.  Problem List: Patient Active Problem List   Diagnosis Date Noted   Elevated blood pressure reading 10/01/2022   Auditory hallucination 10/01/2021   Seizures 09/04/2019   Autism      Past Medical History:  Diagnosis Date   Autism    Seizures (HCC)     Past medical history comments: See HPI Copied from previous record: She was born full-term via normal vaginal delivery with no perinatal events.  Her birth weight was 7 pounds 9 ounces. Milestones were delayed   Surgical history: Past Surgical History:  Procedure Laterality Date   NO PAST SURGERIES       Family history: family history is not on file.   Social history: Social  History   Socioeconomic History   Marital status: Single    Spouse name: Not on file   Number of children: Not on file   Years of education: Not on file   Highest education level: Not on file  Occupational History   Not on file  Tobacco Use   Smoking status: Never    Passive exposure: Current   Smokeless tobacco: Never  Substance and Sexual Activity   Alcohol use: Not on file   Drug use: Not on file   Sexual activity: Not on file  Other Topics Concern   Not on file  Social History Narrative   Caitlyn is a high school graduate 2021   She lives with both parents.    She is employed with [employer] full time   Social Determinants of Corporate investment banker Strain: Not on file  Food Insecurity: Not on file  Transportation Needs: Not on file  Physical Activity: Not on file  Stress: Not on file  Social Connections: Not on file  Intimate Partner Violence: Not on file    Past/failed meds:  Allergies: No Known Allergies   Immunizations:  There is no immunization history on file for this patient.   Diagnostics/Screenings: Copied from previous record: 08/12/2019 - rEEG - This EEG is normal during awake state. Please note that normal EEG does not exclude epilepsy, clinical correlation is indicated. Keturah Shavers, MD   Physical Exam: BP (!) 130/90 (BP Location: Left Arm, Patient Position: Sitting, Cuff Size: Normal)   Pulse 90   Ht  5' 6.73" (1.695 m)   Wt 208 lb (94.3 kg)   LMP 02/27/2023 (Exact Date)   BMI 32.84 kg/m   General: Well developed, well nourished young woman, seated on exam table, in no evident distress Head: Head normocephalic and atraumatic.  Oropharynx benign. Neck: Supple Cardiovascular: Regular rate and rhythm, no murmurs Respiratory: Breath sounds clear to auscultation Musculoskeletal: No obvious deformities or scoliosis Skin: No rashes or neurocutaneous lesions  Neurologic Exam Mental Status: Awake and fully alert.  Oriented to place and  time.  Fund of knowledge subnormal for age.  Concrete language. Speech fluent. Fairly good eye contact. Mood and affect appropriate. Cranial Nerves: Fundoscopic exam reveals sharp disc margins.  Pupils equal, briskly reactive to light.  Extraocular movements full without nystagmus. Hearing intact and symmetric to whisper.  Facial sensation intact.  Face tongue, palate move normally and symmetrically. Shoulder shrug normal Motor: Normal bulk and tone. Normal strength in all tested extremity muscles. Sensory: Intact to touch and temperature in all extremities.  Coordination: Rapid alternating movements normal in all extremities.  Finger-to-nose and heel-to shin performed accurately bilaterally.  Romberg negative. Gait and Station: Arises from chair without difficulty.  Stance is normal. Gait demonstrates normal stride length and balance.   Able to heel, toe and tandem walk without difficulty. Reflexes: 1+ and symmetric. Toes downgoing.   Impression: Seizures - Plan: levETIRAcetam (KEPPRA) 750 MG tablet, oxcarbazepine (TRILEPTAL) 600 MG tablet  Autism   Recommendations for plan of care: The patient's previous Epic records were reviewed. No recent diagnostic studies to be reviewed with the patient.  I talked with her about her desire to stop taking seizure medication and explained that we will perform an EEG prior to her next visit to determine if she can safely taper off medication.  Plan until next visit: Continue medications as prescribed for now We will perform an EEG prior to next visit to determine if she can safely taper off medication.  Call if seizures occur Return in about 6 months (around 10/03/2023).  The medication list was reviewed and reconciled. No changes were made in the prescribed medications today. A complete medication list was provided to the patient.  Orders Placed This Encounter  Procedures   EEG Child    Standing Status:   Future    Standing Expiration Date:    04/07/2024    Scheduling Instructions:     Schedule in early October 2024, about a week before her next visit in October 2024    Order Specific Question:   Where should this test be performed?    Answer:   [facility]    Order Specific Question:   Reason for exam    Answer:   Other (see comment)    Order Specific Question:   Comment    Answer:   History of seizures, on Levetiracetam and Oxcarbazepine. Perform EEG to determinei if she can safely taper off medication    Allergies as of 04/03/2023   No Known Allergies      Medication List        Accurate as of April 03, 2023 11:59 PM. If you have any questions, ask your nurse or doctor.          levETIRAcetam 750 MG tablet Commonly known as: Keppra Take 1 tablet twice per day   oxcarbazepine 600 MG tablet Commonly known as: Trileptal Take 1 tablet in the morning and 1 tablet at night   Valtoco 20 MG Dose 2 x 10 MG/0.1ML Lqpk  Generic drug: diazePAM (20 MG Dose) Place 20 mg into the nose as needed (Give nasally for seizures lasting 2 minutes or longer).      Total time spent with the patient was 25 minutes, of which 50% or more was spent in counseling and coordination of care.  Elveria Rising NP-C [facility] [address] [place], [place] [address] Ph. [phone] Fax [phone]

## 2023-04-08 ENCOUNTER — Encounter (INDEPENDENT_AMBULATORY_CARE_PROVIDER_SITE_OTHER): Payer: Self-pay | Admitting: Family

## 2023-05-08 ENCOUNTER — Other Ambulatory Visit (INDEPENDENT_AMBULATORY_CARE_PROVIDER_SITE_OTHER): Payer: Self-pay | Admitting: Family

## 2023-05-08 DIAGNOSIS — R569 Unspecified convulsions: Secondary | ICD-10-CM

## 2023-05-08 MED ORDER — VALTOCO 20 MG DOSE 10 MG/0.1ML NA LQPK: 20.0000 mg | NASAL | 5 refills | Status: AC | PRN

## 2023-05-08 NOTE — Telephone Encounter (Signed)
  Name of who is calling: [place]  Caller's Relationship to Patient: self  Best contact number: [phone]  Provider they see: Inetta Fermo  Reason for call: calling for her Diazepam spray. She only has an expired spray left and wanted to know if she should use that or if she needs a new script to be filled and sent in. Please follow up as she is out.      PRESCRIPTION REFILL ONLY  Name of prescription:diazepam (DIASTAT ACUDIAL) 10 MG GE   Pharmacy- [facility] [id number] - [place], [place] - [address] AT [facility] [address] & [facility] [address]

## 2023-05-08 NOTE — Telephone Encounter (Signed)
Last OV 04/03/2023 Next OV 10/04/2023

## 2023-05-25 ENCOUNTER — Telehealth (INDEPENDENT_AMBULATORY_CARE_PROVIDER_SITE_OTHER): Payer: Self-pay

## 2023-05-25 DIAGNOSIS — R569 Unspecified convulsions: Secondary | ICD-10-CM

## 2023-05-25 MED ORDER — LEVETIRACETAM 750 MG PO TABS: ORAL_TABLET | ORAL | 3 refills | Status: DC

## 2023-05-25 NOTE — Telephone Encounter (Addendum)
VM appears to just say call me later- RN just left message calling with instructions from Caitlyn Weaver - please check your my chart for the instructions and if this is not the correct number please let us know and we will remove it.   Sent my chart message with instructions and called other number listed which is her mom.  Requested she ask her to read the mychart message and verified the number I called was correct for her.

## 2023-05-25 NOTE — Telephone Encounter (Signed)
Incoming Call/Triage: General Seizure Questions reviewed below:   Frequency of seizures (or last) - number in a day, week, month, etc. Ask when last seizure occurred. 05-07-2023 Seizure. Used Emergency medication "on myself". This lasted "not longer than a minute, last one prior to that had been a long time"  Description of seizures - if caller says "usual seizures", get description anyway. "I was hearing voices in my head at first, face was dropping to left side, I froze, then grabbed emergency medication and this stopped the event".   Seizure medications - verify dose, type, frequency, compliance. Ask about side effects. "Keppra 750mg  tablet-1 twice daily, Trileptal 600mg  morning and night, Valtoco (emergency med)"  Has Patient been sick, under undue stress, has missed sleep. "No"   If the caller reports a rash, ask when the med was started, if any other meds were given at the same time, any different foods, detergents, lotions, etc. Get description of rash, along with any other symptoms with the rash (nausea, vomiting, diarrhea, etc). Sometimes best to have patient stop by to look at rash if parents have difficulty describing or are unreliable in description. "No"   Call and information routed to [facility] for urgent review.  B. Roten CMA

## 2023-05-25 NOTE — Telephone Encounter (Signed)
Please instruct her to increase the Keppra dose to 1 in the morning and 2 at night. I will update her prescription. Let me know if she has any questions. Thanks, Inetta Fermo

## 2023-09-26 ENCOUNTER — Ambulatory Visit (INDEPENDENT_AMBULATORY_CARE_PROVIDER_SITE_OTHER): Payer: Medicaid Other | Admitting: Family

## 2023-09-26 DIAGNOSIS — R569 Unspecified convulsions: Secondary | ICD-10-CM

## 2023-09-26 NOTE — Progress Notes (Unsigned)
EEG complete - results pending 

## 2023-09-27 NOTE — Procedures (Signed)
Patient:  Caitlyn Weaver   Sex: female  DOB:  02/22/2001  Date of study:    09/26/2023              Clinical history: This is a 22 year old female with history of autism spectrum disorder and seizure disorder which was more clinical with normal previous EEG.  Patient has been seizure-free and EEG was done to evaluate for possibility of discontinuing medication.  Medication:   Keppra, Trileptal            Procedure: The tracing was carried out on a 32 channel digital Cadwell recorder reformatted into 16 channel montages with 1 devoted to EKG.  The 10 /20 international system electrode placement was used. Recording was done during awake state.  Recording time 33 minutes.   Description of findings: Background rhythm consists of amplitude of   35 microvolt and frequency of 9-10 hertz posterior dominant rhythm. There was normal anterior posterior gradient noted. Background was well organized, continuous and symmetric with no focal slowing. There was muscle artifact noted. Hyperventilation resulted in slowing of the background activity. Photic stimulation using stepwise increase in photic frequency resulted in bilateral symmetric driving response. Throughout the recording there were no focal or generalized epileptiform activities in the form of spikes or sharps noted. There were no transient rhythmic activities or electrographic seizures noted. One lead EKG rhythm strip revealed sinus rhythm at a rate of 60 bpm.  Impression: This EEG is normal during awake state. Please note that normal EEG does not exclude epilepsy, clinical correlation is indicated.     Keturah Shavers, MD

## 2023-10-03 ENCOUNTER — Other Ambulatory Visit (INDEPENDENT_AMBULATORY_CARE_PROVIDER_SITE_OTHER): Payer: Self-pay

## 2023-10-04 ENCOUNTER — Ambulatory Visit (INDEPENDENT_AMBULATORY_CARE_PROVIDER_SITE_OTHER): Payer: Medicaid Other | Admitting: Family

## 2023-10-04 ENCOUNTER — Encounter (INDEPENDENT_AMBULATORY_CARE_PROVIDER_SITE_OTHER): Payer: Self-pay | Admitting: Family

## 2023-10-04 VITALS — BP 150/100 | HR 84 | Wt 200.4 lb

## 2023-10-04 DIAGNOSIS — R569 Unspecified convulsions: Secondary | ICD-10-CM | POA: Diagnosis not present

## 2023-10-04 DIAGNOSIS — R030 Elevated blood-pressure reading, without diagnosis of hypertension: Secondary | ICD-10-CM | POA: Diagnosis not present

## 2023-10-04 DIAGNOSIS — F840 Autistic disorder: Secondary | ICD-10-CM

## 2023-10-04 MED ORDER — OXCARBAZEPINE ER 600 MG PO TB24: ORAL_TABLET | ORAL | 3 refills | Status: DC

## 2023-10-04 NOTE — Progress Notes (Signed)
Caitlyn Weaver   MRN:  [id number]  06/26/2001   Provider: Elveria Rising NP-C Location of Care: [facility]  Visit type: Return visit  Last visit: 04/03/2023  Referral source: Stevphen Rochester, MD History from: Epic chart and patient  Brief history:  Copied from previous record: History of autism spectrum disorder and new onset seizure activity on 07/26/2019 that lasted approximately 15 minutes. She was seen in the ER and then evaluated in the office by Dr Devonne Doughty. An EEG was normal. She had a second seizure on September 4 and was started on Levetiracetam on September 03, 2019. Another seizure occurred on July 30, 2021. Oxcarbazepine was added at that time. She has reported hearing voices just prior to a seizure occurring.    Today's concerns: In June, she reported a seizure that occurred on 05/07/2023, lasting about 1 minute. The Levetiracetam dose was increased at that time.  She reports today that she had another seizure on 08/07/2023. She reports that she missed taking her medication the night before and had a seizure the following morning. She reports that she felt it coming, thinks it lasted about a minute, and was briefly confused afterwards.  She reports that she does not usually miss medication doses. She usually gets at least 8 hours of sleep each night.  She continues to work at [employer].  Caitlyn has been otherwise generally healthy since she was last seen. No health concerns today other than previously mentioned.  Review of systems: Please see HPI for neurologic and other pertinent review of systems. Otherwise all other systems were reviewed and were negative.  Problem List: Patient Active Problem List   Diagnosis Date Noted   Elevated blood pressure reading 10/01/2022   Auditory hallucination 10/01/2021   Seizures (HCC) 09/04/2019   Autism      Past Medical History:  Diagnosis Date   Autism    Seizures (HCC)      Past medical history comments: See HPI Copied from previous record: She was born full-term via normal vaginal delivery with no perinatal events.  Her birth weight was 7 pounds 9 ounces. Milestones were delayed   Surgical history: Past Surgical History:  Procedure Laterality Date   NO PAST SURGERIES       Family history: family history is not on file.   Social history: Social History   Socioeconomic History   Marital status: Single    Spouse name: Not on file   Number of children: Not on file   Years of education: Not on file   Highest education level: Not on file  Occupational History   Not on file  Tobacco Use   Smoking status: Never    Passive exposure: Current   Smokeless tobacco: Never  Substance and Sexual Activity   Alcohol use: Not on file   Drug use: Not on file   Sexual activity: Not on file  Other Topics Concern   Not on file  Social History Narrative   Caitlyn is a high school graduate 2021   She lives with both parents.    She is employed with [employer] full time   Social Determinants of Corporate investment banker Strain: Low Risk  (04/17/2023)   Received from [facility], [facility]   Overall Financial Resource Strain (CARDIA)    Difficulty of Paying Living Expenses: Not hard at all  Food Insecurity: No Food Insecurity (04/17/2023)   Received from [facility], [facility]  Hunger Vital Sign    Worried About Running Out of Food in the Last Year: Never true    Ran Out of Food in the Last Year: Never true  Transportation Needs: No Transportation Needs (04/17/2023)   Received from [facility], [facility]   PRAPARE - Transportation    Lack of Transportation (Medical): No    Lack of Transportation (Non-Medical): No  Physical Activity: Insufficiently Active (04/17/2023)   Received from [facility], [facility]   Exercise Vital Sign    Days of Exercise per Week: 2 days    Minutes of Exercise per Session: 20 min  Stress: No Stress  Concern Present (04/17/2023)   Received from [facility], [facility] - Occupational Stress Questionnaire    Feeling of Stress : Not at all  Social Connections: Moderately Integrated (04/17/2023)   Received from [facility], [facility]   Social Network    How would you rate your social network (family, work, friends)?: Adequate participation with social networks  Intimate Partner Violence: Not At Risk (04/17/2023)   Received from [facility], [facility]   HITS    Over the last 12 months how often did your partner physically hurt you?: 1    Over the last 12 months how often did your partner insult you or talk down to you?: 1    Over the last 12 months how often did your partner threaten you with physical harm?: 1    Over the last 12 months how often did your partner scream or curse at you?: 1    Past/failed meds: Copied from previous record:  Allergies: No Known Allergies   Immunizations:  There is no immunization history on file for this patient.    Diagnostics/Screenings: Copied from previous record: 09/26/2023 - rEEG- This EEG is normal during awake state. Please note that normal EEG does not exclude epilepsy, clinical correlation is indicated.  Keturah Shavers, MD  08/12/2019 - rEEG - This EEG is normal during awake state. Please note that normal EEG does not exclude epilepsy, clinical correlation is indicated. Keturah Shavers, MD   Physical Exam: BP (!) 150/100 (BP Location: Left Arm, Patient Position: Sitting, Cuff Size: Large)   Pulse 84   Wt 200 lb 6.4 oz (90.9 kg)   LMP 09/18/2023 (Exact Date)   BMI 31.64 kg/m   General: Well developed, well nourished young woman, seated on exam table, in no evident distress Head: Head normocephalic and atraumatic.  Oropharynx benign. Neck: Supple Cardiovascular: Regular rate and rhythm, no murmurs Respiratory: Breath sounds clear to auscultation Musculoskeletal: No obvious  deformities or scoliosis Skin: No rashes or neurocutaneous lesions  Neurologic Exam Mental Status: Awake and fully alert.  Oriented to place and time. Language is concrete and fairly limited. Variable eye contact. Cranial Nerves: Fundoscopic exam reveals sharp disc margins.  Pupils equal, briskly reactive to light.  Extraocular movements full without nystagmus. Hearing intact and symmetric to whisper.  Facial sensation intact.  Face tongue, palate move normally and symmetrically. Shoulder shrug normal Motor: Normal bulk and tone. Normal strength in all tested extremity muscles. Sensory: Intact to touch and temperature in all extremities.  Coordination: Rapid alternating movements normal in all extremities.  Finger-to-nose and heel-to shin performed accurately bilaterally.  Romberg negative. Gait and Station: Arises from chair without difficulty.  Stance is normal. Gait demonstrates normal stride length and balance.   Able to heel, toe and tandem walk without difficulty. Reflexes: 1+ and symmetric.  Toes downgoing.   Impression: Seizures (HCC) - Plan: OXcarbazepine ER 600 MG TB24  Elevated blood pressure reading  Autism   Recommendations for plan of care: The patient's previous Epic records were reviewed. I reviewed the EEG done earlier this month with the patient. I explained that she needs to continue taking seizure medication and that because of her recent seizures, that a dose increase was indicated. I also recommended changing the Oxcarbazepine to extended release formulation for better coverage.  Plan until next visit: Continue taking Levetiracetam as prescribed  Change Oxcarbazepine to Oxcarbazepine XR, and increase the dose to 1AM and 2PM.  Call if more seizures occur.  BP is elevated today. Caitlyn will contact her PCP about this finding.  Call for any questions or concerns Return in about 6 months (around 04/03/2024).  The medication list was reviewed and reconciled. I reviewed  the changes that were made in the prescribed medications today. A complete medication list was provided to the patient.  Allergies as of 10/04/2023   No Known Allergies      Medication List        Accurate as of October 04, 2023 11:59 PM. If you have any questions, ask your nurse or doctor.          STOP taking these medications    oxcarbazepine 600 MG tablet Commonly known as: Trileptal Replaced by: OXcarbazepine ER 600 MG Tb24 Stopped by: Elveria Rising       TAKE these medications    acetaminophen-codeine 300-30 MG tablet Commonly known as: TYLENOL #3 Take 1 tablet by mouth every 4 (four) hours as needed.   levETIRAcetam 750 MG tablet Commonly known as: Keppra Take 1 tablet in the morning and 2 tablets at night   OXcarbazepine ER 600 MG Tb24 Take 1 tablet in the morning and take 2 tablets at night Replaces: oxcarbazepine 600 MG tablet Started by: Elveria Rising   Valtoco 20 MG Dose 10 MG/0.1ML Lqpk Generic drug: diazePAM (20 MG Dose) Place 20 mg into the nose as needed (Give nasally for seizures lasting 2 minutes or longer).      Total time spent with the patient was 30 minutes, of which 50% or more was spent in counseling and coordination of care.  Elveria Rising NP-C [facility] [address], [address] [place], [place] [address] Ph. [phone] Fax [phone]

## 2023-10-04 NOTE — Patient Instructions (Addendum)
It was a pleasure to see you today!  Instructions for you until your next appointment are as follows: Continue taking the Levetiracetam as prescribed I am changing the Oxcarbazepine to an extended release tablet. When you get the new prescription, take 1 in the morning and 2 at night.  Let me know if seizures occur Your blood pressure is elevated today at 150/100. Be sure to follow up with your PCP soon about that.  Please sign up for MyChart if you have not done so. Please plan to return for follow up in 6 months or sooner if needed.  Feel free to contact our office during normal business hours at [phone] with questions or concerns. If there is no answer or the call is outside business hours, please leave a message and our clinic staff will call you back within the next business day.  If you have an urgent concern, please stay on the line for our after-hours answering service and ask for the on-call neurologist.     I also encourage you to use MyChart to communicate with me more directly. If you have not yet signed up for MyChart within [facility], the front desk staff can help you. However, please note that this inbox is NOT monitored on nights or weekends, and response can take up to 2 business days.  Urgent matters should be discussed with the on-call pediatric neurologist.   At [facility], we are committed to providing exceptional care. You will receive a patient satisfaction survey through text or email regarding your visit today. Your opinion is important to me. Comments are appreciated.

## 2023-10-06 ENCOUNTER — Encounter (INDEPENDENT_AMBULATORY_CARE_PROVIDER_SITE_OTHER): Payer: Self-pay | Admitting: Family

## 2024-04-10 ENCOUNTER — Ambulatory Visit (INDEPENDENT_AMBULATORY_CARE_PROVIDER_SITE_OTHER): Payer: Medicaid Other | Admitting: Family

## 2024-04-14 ENCOUNTER — Other Ambulatory Visit (INDEPENDENT_AMBULATORY_CARE_PROVIDER_SITE_OTHER): Payer: Self-pay | Admitting: Family

## 2024-04-14 DIAGNOSIS — R569 Unspecified convulsions: Secondary | ICD-10-CM

## 2024-05-07 NOTE — Progress Notes (Signed)
 Caitlyn Weaver   MRN:  [id number]  May 21, 2001   Provider: Lyndol Santee NP-C Location of Care: [facility]  Visit type: Return visit  Last visit: 10/04/2023  Referral source: Authur Leghorn, MD History from: Epic chart and patient  Brief history:  Copied from previous record: History of autism spectrum disorder and new onset seizure activity on 07/26/2019 that lasted approximately 15 minutes. She was seen in the ER and then evaluated in the office by Dr Colvin Dec. An EEG was normal. She had a second seizure on September 4 and was started on Levetiracetam  on September 03, 2019. Another seizure occurred on July 30, 2021. Oxcarbazepine  was added at that time. She has reported hearing voices just prior to a seizure occurring.   Today's concerns: She denies any seizures since her last visit  She says that she has irregular menstrual cycles and is followed by her PCP for that.  She has history of thyromegaly and is followed by her PCP as well for that.  She continues to work at [employer] Caitlyn has been otherwise generally healthy since she was last seen. No health concerns today other than previously mentioned.  Review of systems: Please see HPI for neurologic and other pertinent review of systems. Otherwise all other systems were reviewed and were negative.  Problem List: Patient Active Problem List   Diagnosis Date Noted   Elevated blood pressure reading 10/01/2022   Auditory hallucination 10/01/2021   Seizures (HCC) 09/04/2019   Autism      Past Medical History:  Diagnosis Date   Autism    Seizures (HCC)     Past medical history comments: See HPI Copied from previous record: She was born full-term via normal vaginal delivery with no perinatal events.  Her birth weight was 7 pounds 9 ounces. Milestones were delayed   Surgical history: Past Surgical History:  Procedure Laterality Date   NO PAST  SURGERIES     WISDOM TOOTH EXTRACTION       Family history: family history is not on file.   Social history: Social History   Socioeconomic History   Marital status: Single    Spouse name: Not on file   Number of children: Not on file   Years of education: Not on file   Highest education level: Not on file  Occupational History   Not on file  Tobacco Use   Smoking status: Never    Passive exposure: Current   Smokeless tobacco: Never  Substance and Sexual Activity   Alcohol use: Not on file   Drug use: Not on file   Sexual activity: Not on file  Other Topics Concern   Not on file  Social History Narrative   Caitlyn is a high school graduate 2021   She lives with both parents.    She is employed with [employer] full time   Social Drivers of Corporate investment banker Strain: Low Risk  (04/21/2024)   Received from [facility]   Overall Financial Resource Strain (CARDIA)    Difficulty of Paying Living Expenses: Not hard at all  Food Insecurity: No Food Insecurity (04/21/2024)   Received from [facility]   Hunger Vital Sign    Worried About Running Out of Food in the Last Year: Never true    Ran Out of Food in the Last Year: Never true  Transportation Needs: No Transportation Needs (04/21/2024)   Received from [facility]   PRAPARE -  Administrator, Civil Service (Medical): No    Lack of Transportation (Non-Medical): No  Physical Activity: Insufficiently Active (04/21/2024)   Received from [facility]   Exercise Vital Sign    Days of Exercise per Week: 7 days    Minutes of Exercise per Session: 10 min  Stress: No Stress Concern Present (04/21/2024)   Received from [facility] of Occupational Health - Occupational Stress Questionnaire    Feeling of Stress : Not at all  Social Connections: Socially Integrated (04/21/2024)   Received from [facility]   Social Network    How would you rate your social network (family, work, friends)?:  Good participation with social networks  Intimate Partner Violence: Not At Risk (04/21/2024)   Received from [facility]   HITS    Over the last 12 months how often did your partner physically hurt you?: Never    Over the last 12 months how often did your partner insult you or talk down to you?: Never    Over the last 12 months how often did your partner threaten you with physical harm?: Never    Over the last 12 months how often did your partner scream or curse at you?: Never   Past/failed meds:  Allergies: No Known Allergies   Immunizations:  There is no immunization history on file for this patient.   Diagnostics/Screenings: Copied from previous record: 09/26/2023 - rEEG- This EEG is normal during awake state. Please note that normal EEG does not exclude epilepsy, clinical correlation is indicated.  Ventura Gins, MD   08/12/2019 - rEEG - This EEG is normal during awake state. Please note that normal EEG does not exclude epilepsy, clinical correlation is indicated. Ventura Gins, MD   Physical Exam: BP 120/84   Pulse 84   Ht 5' 6.75" (1.695 m)   Wt 196 lb (88.9 kg)   BMI 30.93 kg/m  Wt Readings from Last 3 Encounters:  05/08/24 196 lb (88.9 kg)  10/04/23 200 lb 6.4 oz (90.9 kg)  04/03/23 208 lb (94.3 kg)     General: Well developed, well nourished woman, seated on exam table, in no evident distress Head: Head normocephalic and atraumatic.  Oropharynx benign. Neck: Supple Cardiovascular: Regular rate and rhythm, no murmurs Respiratory: Breath sounds clear to auscultation Musculoskeletal: No obvious deformities or scoliosis Skin: No rashes or neurocutaneous lesions  Neurologic Exam Mental Status: Awake and fully alert.  Oriented to place and time. Variable eye contact. Language is concrete and limited Cranial Nerves: Fundoscopic exam reveals sharp disc margins.  Pupils equal, briskly reactive to light.  Extraocular movements full without nystagmus. Hearing intact and  symmetric to whisper.  Facial sensation intact.  Face tongue, palate move normally and symmetrically. Shoulder shrug normal Motor: Normal bulk and tone. Normal strength in all tested extremity muscles. Sensory: Intact to touch and temperature in all extremities.  Coordination: Rapid alternating movements normal in all extremities.  Romberg negative. Gait and Station: Arises from chair without difficulty.  Stance is normal. Gait demonstrates normal stride length and balance.   Able to heel, toe and tandem walk without difficulty. Reflexes: 1+ and symmetric. Toes downgoing.   Impression: Seizures (HCC) - Plan: levETIRAcetam  (KEPPRA ) 750 MG tablet  Autism   Recommendations for plan of care: The patient's previous Epic records were reviewed. No recent diagnostic studies to be reviewed with the patient.  Plan until next visit: Continue medications as prescribed  Call for questions or concerns Return  in about 6 months (around 11/08/2024).  The medication list was reviewed and reconciled. No changes were made in the prescribed medications today. A complete medication list was provided to the patient.  Allergies as of 05/08/2024   No Known Allergies      Medication List        Accurate as of May 08, 2024  7:24 PM. If you have any questions, ask your nurse or doctor.          STOP taking these medications    acetaminophen -codeine 300-30 MG tablet Commonly known as: TYLENOL  #3 Stopped by: Lyndol Santee       TAKE these medications    levETIRAcetam  750 MG tablet Commonly known as: KEPPRA  TAKE 1 TABLET BY MOUTH IN THE MORNING AND 2 TABLETS BY MOUTH AT NIGHT   OXcarbazepine  ER 600 MG Tb24 Take 1 tablet in the morning and take 2 tablets at night   Valtoco  20 MG Dose 10 MG/0.1ML Lqpk Generic drug: diazePAM  (20 MG Dose) Place 20 mg into the nose as needed (Give nasally for seizures lasting 2 minutes or longer).      Total time spent with the patient was 25 minutes, of  which 50% or more was spent in counseling and coordination of care.  Lyndol Santee NP-C  [facility] [address], [address] [place], [place] [address] Ph. [phone] Fax [phone]

## 2024-05-08 ENCOUNTER — Encounter (INDEPENDENT_AMBULATORY_CARE_PROVIDER_SITE_OTHER): Payer: Self-pay | Admitting: Family

## 2024-05-08 ENCOUNTER — Ambulatory Visit (INDEPENDENT_AMBULATORY_CARE_PROVIDER_SITE_OTHER): Admitting: Family

## 2024-05-08 VITALS — BP 120/84 | HR 84 | Ht 66.75 in | Wt 196.0 lb

## 2024-05-08 DIAGNOSIS — F840 Autistic disorder: Secondary | ICD-10-CM | POA: Diagnosis not present

## 2024-05-08 DIAGNOSIS — R569 Unspecified convulsions: Secondary | ICD-10-CM

## 2024-05-08 MED ORDER — LEVETIRACETAM 750 MG PO TABS: ORAL_TABLET | ORAL | 1 refills | Status: DC

## 2024-05-08 NOTE — Patient Instructions (Signed)
It was a pleasure to see you today!  Instructions for you until your next appointment are as follows: Continue taking your medications as prescribed  Let me know if you have any seizures or any concerns. Please sign up for MyChart if you have not done so. Please plan to return for follow up in 6 months or sooner if needed.  Feel free to contact our office during normal business hours at [phone] with questions or concerns. If there is no answer or the call is outside business hours, please leave a message and our clinic staff will call you back within the next business day.  If you have an urgent concern, please stay on the line for our after-hours answering service and ask for the on-call neurologist.     I also encourage you to use MyChart to communicate with me more directly. If you have not yet signed up for MyChart within [facility], the front desk staff can help you. However, please note that this inbox is NOT monitored on nights or weekends, and response can take up to 2 business days.  Urgent matters should be discussed with the on-call pediatric neurologist.   At [facility], we are committed to providing exceptional care. You will receive a patient satisfaction survey through text or email regarding your visit today. Your opinion is important to me. Comments are appreciated.   

## 2024-05-21 ENCOUNTER — Telehealth (INDEPENDENT_AMBULATORY_CARE_PROVIDER_SITE_OTHER): Payer: Self-pay | Admitting: Family

## 2024-05-21 NOTE — Telephone Encounter (Signed)
 Contacted patient. Verified patients name and DOB.   I also conferenced this call with the pharmacy.   Verified that the pharmacy has both the oxcarbazepine  and Keppra . They are able to fill both medication.   SS. CCMA

## 2024-05-21 NOTE — Telephone Encounter (Signed)
  Name of who is calling: [place]   Caller's Relationship to Patient: SELF   Best contact number:[phone]  Provider they see: [facility]   Reason for call: PA FOR keppra  - She stated they told her the oxcarbazepine  is a 5 day supply only? She would like a call back regarding this      PRESCRIPTION REFILL ONLY  Name of prescription: keppra . Oxcarbazepine     Pharmacy:

## 2024-11-10 ENCOUNTER — Ambulatory Visit (INDEPENDENT_AMBULATORY_CARE_PROVIDER_SITE_OTHER): Admitting: Family

## 2024-11-16 ENCOUNTER — Other Ambulatory Visit (INDEPENDENT_AMBULATORY_CARE_PROVIDER_SITE_OTHER): Payer: Self-pay | Admitting: Family

## 2024-11-16 DIAGNOSIS — R569 Unspecified convulsions: Secondary | ICD-10-CM

## 2024-11-17 ENCOUNTER — Telehealth (INDEPENDENT_AMBULATORY_CARE_PROVIDER_SITE_OTHER): Payer: Self-pay

## 2024-11-17 ENCOUNTER — Telehealth (INDEPENDENT_AMBULATORY_CARE_PROVIDER_SITE_OTHER): Payer: Self-pay | Admitting: Family

## 2024-11-17 DIAGNOSIS — R569 Unspecified convulsions: Secondary | ICD-10-CM

## 2024-11-17 MED ORDER — LEVETIRACETAM 750 MG PO TABS: ORAL_TABLET | ORAL | 0 refills | Status: DC

## 2024-11-17 NOTE — Telephone Encounter (Signed)
 Attempted to contact patients mother to schedule a follow up.  Mother unable to be reached.   LVM to call back.   SS, CCMA

## 2024-11-17 NOTE — Telephone Encounter (Signed)
 Patient called requesting refill levETIRAcetam  (KEPPRA ) 750 MG tablet [[id number]]     Pharmacy  [id number] - [place], [place] - [address] AT [facility] [address] [facility] [address] [facility] [address], [place] [address] Phone: [phone]  Fax: [phone] DEA #: [id number]

## 2024-11-20 NOTE — Progress Notes (Signed)
 [facility] 11/20/24 No primary care provider on file. Treatment Providers Mliss Catchings, DDS  Dental procedures in this visit   [phone] - EXTRACTION, ERUPTED TOOTH REQUIRING REMOVAL OF BONE AND/OR SECTIONING OF TOOTH 1   D7210 - EXTRACTION, ERUPTED TOOTH REQUIRING REMOVAL OF BONE AND/OR SECTIONING OF TOOTH 2   D7210 - EXTRACTION, ERUPTED TOOTH REQUIRING REMOVAL OF BONE AND/OR SECTIONING OF TOOTH 32    HEALTH HISTORY ? Vitals:  BP Readings from Last 1 Encounters:  11/20/24 (!) 148/93   Post op BP 145/93 Pulse:  98 Medical history was reviewed and updated. No contraindication to care. Medical History[1] Surgical History[2] Social History   Tobacco Use   Smoking status: Never   Smokeless tobacco: Never  Substance Use Topics   Alcohol use: Never   Family History[3] Medications Ordered Prior to Encounter[4] Medical Risk Assessment ASA GRADE: ASA 1 - Normal health patient  Subjective: Patient presents  for Extraction of  teeth 1,2,32 , pt will have tooth #18 extracted at a later date. Patient reports  no change to condition since last visit.   Objective:  Radiographs taken: Panoramic  All Radiographs Entry:    Assessment: Decay tooth #2, Inadequate arch length teeth 1 and 32. Pt presents to clinic for extraction of teeth 1, 2, and 32. Tooth #18 will be extracted at next appointment. Discussed with patient risks and precautions of procedure. Necessary consent were obtained from patient.  RISKS & LIABILITY GIVEN: Discussed with patient potential risk of procedural complication  Surgical time out performed to confirm correct patient, correct teeth for treatment, and confirmed diagnosis for treatment.  Consent obtained? Yes  Topical (Benzocaine 20%) placed. Right buccal mucosa ANESTHETIC 1:Lidocaine 2% epi: 1:100K x Carpules: 3 carpules;  ANESTHETIC 2:Marcaine 0.5% w/Epi 1:200k x Carpules: 1 carpule; administered right PSA and palatally and right IA  and long buccal. Profound anesthesia achieved. After anesthesia was achieved, FTMPF reflected buccally adjacent to teeth 1 and 2. Using a 702 bur teeth were sectioned in a T fashion and roots were split and removed individually. Buccal plate remained intact. No sinus exposure. Sockets curetted and bone smoothed with bone file. Next addressed tooth #32. FTMPF reflected to the buccal with posterior releasing incision to the buccal in a hockey stick fashion. Upon reflection of the soft tissue a 702 bur was used to create trough. Tooth was elevated with  straight elevator and delivered. Buccal and lingual plates remained intact.  Socket curetted and sites were copiously irrigated with sterile water. Placed moistened 2x2 guaze at extraction site with instructions to remove in 20-30 minutes unless excessive bleeding noted. Written and verbal instructions given to patient prior to discharge.  Suture type: 4-0 chromic gut suture placed  PRESCRIPTION: Peridex mouth rinse to start day after procedure rinsing BID, Norco 5/325mg  disp 6 tabs prn severe pain. No antibiotics given. Recommended OTC medication Tylenol  500 mg and Ibuprofen 400 mg be taken together every 4 hours.  Additional notes: None  Plan: Discussed with patient the importance of preserving the clot in socket by avoiding for a minimum of 24 hours: spitting, swishing, use of mouthwash, use of peroxide, use of straws, and smoking.  Reviewed post op instructions and answered all of patient's questions. Prescribed the following medications: PRESCRIPTION: none Advised patient, if after being dismissed patient notes excessive bleeding to contact our office immediately or, if unable to reach our office, contact Dr Catchings at phone number given. Patient will return on: 12/18  for post op and  extraction of #18 Patient expressed understanding of all that was discussed.   Treatment Providers Dental Assistant: Laymon Molly DAII  Mliss Catchings, DDS Oral  and Maxillofacial Surgery [facility] [place] [facility] [address] [place] [address] [place] [address] [phone]        [1] Past Medical History: Diagnosis Date   Autism    History of epilepsy    Hypertension   [2] History reviewed. No pertinent surgical history. [3] Family History Problem Relation Name Age of Onset   Hypertension Mother     Hypertension Father    [4] Current Outpatient Medications on File Prior to Visit  Medication Sig Dispense Refill   levETIRAcetam  (KEPPRA ) 750 mg tablet TAKE 1 TABLET BY MOUTH IN THE MORNING AND 2 TABLETS AT NIGHT     OXcarbazepine  600 mg tablet extended release 24 hr TAKE 1 TABLET BY MOUTH EVERY MORNING AND 2 TABLETS AT NIGHT     OXCARBAZEPINE  ORAL      No current facility-administered medications on file prior to visit.

## 2024-12-14 NOTE — Progress Notes (Unsigned)
 "  Caitlyn Weaver   MRN:  [id number]  September 28, 2001   Provider: Ellouise Bollman NP-C Location of Care: [facility]  Visit type: Return visit  Last visit: 05/08/2024  Referral source: Gladystine Erminio CROME, MD PCP: Gladystine Erminio CROME, MD History from: Epic chart and patient  Brief history:  Copied from previous record: History of autism spectrum disorder and new onset seizure activity on 07/26/2019 that lasted approximately 15 minutes. She was seen in the ER and then evaluated in the office by Dr Corinthia. An EEG was normal. She had a second seizure on September 4 and was started on Levetiracetam  on September 03, 2019. Another seizure occurred on July 30, 2021. Oxcarbazepine  was added at that time. She has reported hearing voices just prior to a seizure occurring.   Since last visit: She has remained seizure free since her last visit She had dental extraction with anesthesia earlier this month and tolerated it well She is followed by PCP for hypertension, irregular menses and thyromegaly She continues to work at  [employer] but is looking for a new job [name] has been otherwise generally healthy since she was last seen. No health concerns today other than previously mentioned.  Review of systems: Please see HPI for neurologic and other pertinent review of systems. Otherwise all other systems were reviewed and were negative.  Problem List: Patient Active Problem List   Diagnosis Date Noted   Elevated blood pressure reading 10/01/2022   Auditory hallucination 10/01/2021   Seizures (HCC) 09/04/2019   Autism      Past Medical History:  Diagnosis Date   Autism    Seizures (HCC)     Past medical history comments: See HPI Copied from previous record: She was born full-term via normal vaginal delivery with no perinatal events.  Her birth weight was 7 pounds 9 ounces. Milestones were delayed   Surgical history: Past Surgical History:   Procedure Laterality Date   NO PAST SURGERIES     WISDOM TOOTH EXTRACTION       Family history: family history is not on file.   Social history: Social History   Socioeconomic History   Marital status: Single    Spouse name: Not on file   Number of children: Not on file   Years of education: Not on file   Highest education level: Not on file  Occupational History   Not on file  Tobacco Use   Smoking status: Never    Passive exposure: Current   Smokeless tobacco: Never  Substance and Sexual Activity   Alcohol use: Not on file   Drug use: Not on file   Sexual activity: Not on file  Other Topics Concern   Not on file  Social History Narrative   Caitlyn Weaver is a high school graduate 2021   She lives with both parents.    2 guinea pigs   She is employed with [employer] full time   Social Drivers of Health   Tobacco Use: Low Risk (11/18/2024)   Received from [facility]   Patient History    Smoking Tobacco Use: Never    Smokeless Tobacco Use: Never    Passive Exposure: Never  Financial Resource Strain: Low Risk (04/21/2024)   Received from [facility]   Overall Financial Resource Strain (CARDIA)    Difficulty of Paying Living Expenses: Not hard at all  Food Insecurity: No Food Insecurity (04/21/2024)   Received from [facility]  Within the past 12 months, you worried that your food would run out before you got the money to buy more.: Never true    Within the past 12 months, the food you bought just didn't last and you didn't have money to get more.: Never true  Transportation Needs: No Transportation Needs (04/21/2024)   Received from [facility]   PRAPARE - Transportation    Lack of Transportation (Medical): No    Lack of Transportation (Non-Medical): No  Physical Activity: Insufficiently Active (04/21/2024)   Received from [facility]   Exercise Vital Sign    On average, how many days per week do you engage in moderate to strenuous exercise (like a brisk  walk)?: 7 days    On average, how many minutes do you engage in exercise at this level?: 10 min  Stress: No Stress Concern Present (04/21/2024)   Received from [facility] - Occupational Stress Questionnaire    Feeling of Stress : Not at all  Social Connections: Socially Integrated (04/21/2024)   Received from [facility]   Social Network    How would you rate your social network (family, work, friends)?: Good participation with social networks  Intimate Partner Violence: Not At Risk (04/21/2024)   Received from [facility]   HITS    Over the last 12 months how often did your partner physically hurt you?: Never    Over the last 12 months how often did your partner insult you or talk down to you?: Never    Over the last 12 months how often did your partner threaten you with physical harm?: Never    Over the last 12 months how often did your partner scream or curse at you?: Never  Depression (PHQ2-9): Not on file  Alcohol Screen: Not on file  Housing: Low Risk (04/21/2024)   Received from [facility]    In the last 12 months, was there a time when you were not able to pay the mortgage or rent on time?: No    In the past 12 months, how many times have you moved where you were living?: 0    At any time in the past 12 months, were you homeless or living in a shelter (including now)?: No  Utilities: Not At Risk (04/21/2024)   Received from [facility]ities    Threatened with loss of utilities: No  Health Literacy: Not on file    Past/failed meds:  Allergies: Allergies[1]   Immunizations:  There is no immunization history on file for this patient.   Diagnostics/Screenings: Copied from previous record: 09/26/2023 - rEEG- This EEG is normal during awake state. Please note that normal EEG does not exclude epilepsy, clinical correlation is indicated.  Norwood Abu, MD   08/12/2019 - rEEG - This EEG is normal during awake  state. Please note that normal EEG does not exclude epilepsy, clinical correlation is indicated. Norwood Abu, MD   Physical Exam: BP 126/72   Pulse 80   Ht 5' 7.13 (1.705 m)   Wt 191 lb (86.6 kg)   BMI 29.80 kg/m   Wt Readings from Last 3 Encounters:  12/16/24 191 lb (86.6 kg)  05/08/24 196 lb (88.9 kg)  10/04/23 200 lb 6.4 oz (90.9 kg)  General: Well developed, well nourished young woman, seated in exam room, in no evident distress Head: Head normocephalic and atraumatic.  Oropharynx benign. Neck: Supple Cardiovascular: Regular rate and  rhythm, no murmurs Respiratory: Breath sounds clear to auscultation Musculoskeletal: No obvious deformities or scoliosis Skin: No rashes or neurocutaneous lesions  Neurologic Exam Mental Status: Awake and fully alert.  Oriented to place and time.  Concrete language and variable eye contact. Able to follow instructions and participate in examination. Cranial Nerves: Fundoscopic exam reveals red reflex.  Pupils equal, briskly reactive to light. Turns to localize faces, objects and sounds in the periphery. Facial sensation intact.  Face tongue, palate move normally and symmetrically. Shoulder shrug normal Motor: Normal bulk and tone. Normal strength in all tested extremity muscles. Sensory: Intact to touch and temperature in all extremities.  Coordination: Rapid alternating movements normal in all extremities.  Finger-to-nose and heel-to shin performed accurately bilaterally.  Romberg negative. Gait and Station: Arises from chair without difficulty.  Stance is normal. Gait demonstrates normal stride length and balance.   Able to heel, toe and tandem walk without difficulty.  Impression: Seizures (HCC) - Plan: levETIRAcetam  (KEPPRA ) 750 MG tablet, OXcarbazepine  ER 600 MG TB24  Autism   Recommendations for plan of care: The patient's previous Epic records were reviewed. No recent diagnostic studies to be reviewed with the patient.    Recommendations and plan until next visit: Continue medications as prescribed  Continue follow up with PCP Call for questions or concerns Return in about 6 months (around 06/16/2025).  The medication list was reviewed and reconciled. No changes were made in the prescribed medications today. A complete medication list was provided to the patient.  Allergies as of 12/16/2024   No Known Allergies      Medication List        Accurate as of December 16, 2024  3:59 PM. If you have any questions, ask your nurse or doctor.          levETIRAcetam  750 MG tablet Commonly known as: KEPPRA  TAKE 1 TABLET BY MOUTH IN THE MORNING AND 2 TABLETS BY MOUTH AT NIGHT   OXcarbazepine  ER 600 MG Tb24 TAKE 1 TABLET BY MOUTH EVERY MORNING AND 2 TABLETS AT NIGHT   Valtoco  20 MG Dose 10 MG/0.1ML Lqpk Generic drug: diazePAM  (20 MG Dose) Place 20 mg into the nose as needed (Give nasally for seizures lasting 2 minutes or longer).      I spent 20 minutes caring for the patient today face to face reviewing records, including previous charts and test results, examination of the patient, discussion and education with the parent about her condition, documentation in her chart, developing a plan of care and ordering refills.  Ellouise Bollman NP-C [facility] [address], [address] [place], [place] [address] Ph. [phone] Fax [phone]           [1] No Known Allergies  "

## 2024-12-16 ENCOUNTER — Ambulatory Visit (INDEPENDENT_AMBULATORY_CARE_PROVIDER_SITE_OTHER): Admitting: Family

## 2024-12-16 ENCOUNTER — Encounter (INDEPENDENT_AMBULATORY_CARE_PROVIDER_SITE_OTHER): Payer: Self-pay | Admitting: Family

## 2024-12-16 VITALS — BP 126/72 | HR 80 | Ht 67.13 in | Wt 191.0 lb

## 2024-12-16 DIAGNOSIS — F840 Autistic disorder: Secondary | ICD-10-CM

## 2024-12-16 DIAGNOSIS — R569 Unspecified convulsions: Secondary | ICD-10-CM

## 2024-12-16 MED ORDER — OXCARBAZEPINE ER 600 MG PO TB24: ORAL_TABLET | ORAL | 1 refills | Status: AC

## 2024-12-16 MED ORDER — LEVETIRACETAM 750 MG PO TABS: ORAL_TABLET | ORAL | 1 refills | Status: AC

## 2024-12-16 NOTE — Patient Instructions (Signed)
 It was a pleasure to see you today!  Instructions for you until your next appointment are as follows: Continue taking your medications as prescribed Let me know if you have any seizures Continue following up with your PCP for your blood pressure and general health Please sign up for MyChart if you have not done so. Please plan to return for follow up in 6 months or sooner if needed.  Feel free to contact our office during normal business hours at [phone] with questions or concerns. If there is no answer or the call is outside business hours, please leave a message and our clinic staff will call you back within the next business day.  If you have an urgent concern, please stay on the line for our after-hours answering service and ask for the on-call neurologist.     I also encourage you to use MyChart to communicate with me more directly. If you have not yet signed up for MyChart within [facility], the front desk staff can help you. However, please note that this inbox is NOT monitored on nights or weekends, and response can take up to 2 business days.  Urgent matters should be discussed with the on-call pediatric neurologist.   At [facility], we are committed to providing exceptional care. You will receive a patient satisfaction survey through text or email regarding your visit today. Your opinion is important to me. Comments are appreciated.

## 2024-12-25 ENCOUNTER — Other Ambulatory Visit (HOSPITAL_COMMUNITY): Payer: Self-pay

## 2024-12-25 ENCOUNTER — Telehealth (INDEPENDENT_AMBULATORY_CARE_PROVIDER_SITE_OTHER): Payer: Self-pay | Admitting: Pharmacy Technician

## 2024-12-25 NOTE — Telephone Encounter (Signed)
 Pharmacy Patient Advocate Encounter   Received notification from CoverMyMeds that prior authorization for OXcarbazepine  ER 600MG  er tablets  is required/requested.   Insurance verification completed.   The patient is insured through [place] MEDICAID.   Per test claim: PA required; PA submitted to above mentioned insurance via Latent Key/confirmation #/[id number] Status is pending

## 2024-12-26 ENCOUNTER — Other Ambulatory Visit (HOSPITAL_COMMUNITY): Payer: Self-pay

## 2024-12-26 ENCOUNTER — Other Ambulatory Visit (INDEPENDENT_AMBULATORY_CARE_PROVIDER_SITE_OTHER): Payer: Self-pay

## 2024-12-26 NOTE — Telephone Encounter (Signed)
 Pharmacy Patient Advocate Encounter  Received notification from [facility] MEDICAID that Prior Authorization for OXcarbazepine  ER 600MG  er tablets  has been APPROVED from 12/25/24 to 12/25/25. Ran test claim, Copay is $4.00. This test claim was processed through [facility]- copay amounts may vary at other pharmacies due to pharmacy/plan contracts, or as the patient moves through the different stages of their insurance plan.   PA #/Case ID/Reference #: [id number]

## 2024-12-26 NOTE — Telephone Encounter (Signed)
Opened in Error.  SS, CCMA

## 2025-06-18 ENCOUNTER — Ambulatory Visit (INDEPENDENT_AMBULATORY_CARE_PROVIDER_SITE_OTHER): Payer: Self-pay | Admitting: Family
